# Patient Record
Sex: Male | Born: 1955 | Race: White | Hispanic: No | Marital: Married | Smoking: Former smoker
Health system: Southern US, Community
[De-identification: ages and names within clinical notes are randomized; demographics above are authoritative.]

## PROBLEM LIST (undated history)

## (undated) DIAGNOSIS — E785 Hyperlipidemia, unspecified: Secondary | ICD-10-CM

## (undated) HISTORY — DX: Hyperlipidemia, unspecified: E78.5

---

## 2000-06-22 ENCOUNTER — Other Ambulatory Visit: Admission: RE | Admit: 2000-06-22 | Discharge: 2000-06-22 | Payer: Self-pay | Admitting: Urology

## 2004-07-22 ENCOUNTER — Emergency Department (HOSPITAL_COMMUNITY): Admission: EM | Admit: 2004-07-22 | Discharge: 2004-07-22 | Payer: Self-pay | Admitting: Emergency Medicine

## 2019-03-20 ENCOUNTER — Other Ambulatory Visit: Payer: Self-pay | Admitting: Gastroenterology

## 2019-03-20 ENCOUNTER — Other Ambulatory Visit (HOSPITAL_COMMUNITY): Payer: Self-pay | Admitting: Gastroenterology

## 2019-03-20 DIAGNOSIS — R1011 Right upper quadrant pain: Secondary | ICD-10-CM

## 2019-03-24 ENCOUNTER — Ambulatory Visit: Payer: Managed Care, Other (non HMO) | Admitting: Cardiology

## 2019-03-24 ENCOUNTER — Other Ambulatory Visit: Payer: Self-pay

## 2019-03-24 ENCOUNTER — Encounter: Payer: Self-pay | Admitting: Cardiology

## 2019-03-24 VITALS — BP 107/83 | HR 91 | Temp 98.0°F | Ht 71.0 in | Wt 210.4 lb

## 2019-03-24 DIAGNOSIS — R0789 Other chest pain: Secondary | ICD-10-CM | POA: Insufficient documentation

## 2019-03-24 DIAGNOSIS — R072 Precordial pain: Secondary | ICD-10-CM

## 2019-03-24 DIAGNOSIS — Z8711 Personal history of peptic ulcer disease: Secondary | ICD-10-CM

## 2019-03-24 DIAGNOSIS — E782 Mixed hyperlipidemia: Secondary | ICD-10-CM

## 2019-03-24 MED ORDER — METOPROLOL TARTRATE 25 MG PO TABS: 25.0000 mg | ORAL_TABLET | Freq: Two times a day (BID) | ORAL | 1 refills | Status: AC

## 2019-03-24 NOTE — Progress Notes (Signed)
Patient referred by Pieter Partridge, PA for chest pain  Subjective:   Ian Avila, male    DOB: 02-24-1955, 64 y.o.   MRN: [id number]   Chief Complaint  Patient presents with  . Chest Pain  . New Patient (Initial Visit)    HPI  64 y/o Caucasian male, h/o PUD and GI bleed (in 2000s), now with epigastric/chest pain, exertional dyspea   Patient has had episodes of retrosternal chest pain "all his life". He reportedly had a stress test 30 years ago. More recently, he has had at least two episodes of pain, that woke him up from sleep. Pain was relieved after chewing Aspirin. This pain is not reproduced with exertion. He has had a separate epigastric "soreness" that is always present.   He has hyperlipidemia, is reluctant to take medications , and wants to make lifestyle changes.   He has had peptic ulcer disease 15 years ago, when he was ingesting a lot of NSAIDS. He is not taking NSAIDS anymore.  He works as a Scientist, water quality. He is originally from [place], [place]. He has lived in [place] area for ~50 years. He lives in [place] with his wife.     Past Medical History:  Diagnosis Date  . Hyperlipidemia      History reviewed. No pertinent surgical history.   Social History   Tobacco Use  Smoking Status Former Smoker  . Packs/day: 1.00  . Years: 15.00  . Pack years: 15.00  . Types: Cigarettes  . Quit date: 80  . Years since quitting: 30.1  Smokeless Tobacco Never Used    Social History   Substance and Sexual Activity  Alcohol Use Not Currently     Family History  Problem Relation Age of Onset  . Hypertension Father   . Cancer Father      No current outpatient medications on file prior to visit.   No current facility-administered medications on file prior to visit.    Cardiovascular and other pertinent studies:  EKG 03/24/2019: Sinus rhythm 84 bpm.  Normal EKG.   Recent labs: 03/14/2019: Glucose 79, BUN/Cr 14/0.92. EGFR 89. Na/K  140/4.2. Rest of the CMP normal H/H 14/42. MCV 90. Platelets 288 Chol 202, TG 182, HDL 45, LDL 152   Review of Systems  Cardiovascular: Positive for chest pain. Negative for dyspnea on exertion, leg swelling, palpitations and syncope.         Vitals:   03/24/19 1314  BP: 107/83  Pulse: 91  Temp: 98 F (36.7 C)  SpO2: 98%     Body mass index is 29.34 kg/m. Filed Weights   03/24/19 1314  Weight: 210 lb 6.4 oz (95.4 kg)     Objective:   Physical Exam  Constitutional: He appears well-developed and well-nourished.  Neck: No JVD present.  Cardiovascular: Normal rate, regular rhythm, normal heart sounds and intact distal pulses.  No murmur heard. Pulmonary/Chest: Effort normal and breath sounds normal. He has no wheezes. He has no rales.  Musculoskeletal:        General: No edema.  Nursing note and vitals reviewed.      Assessment & Recommendations:   64 y/o Caucasian male, h/o PUD and GI bleed (in 2000s), now with epigastric/chest pain, exertional dyspea   Chest pain: Atypical, but has risk factor with hyperlipidemia. He is reluctant to start statin and wants to pursue diet and lifestyle changes. It will help to have definitive coronary anatomy evaluation to make recommendations on Aspirin, statin,  given his h/o PUD as well as reluctance to use statin.   Recommend coronary CTA.  Further recommendations after the above testing.    Thank you for referring the patient to [place]. Please feel free to contact with any questions.  Nigel Mormon, MD [facility]. [facility] Pager: [phone] Office: [phone]

## 2019-03-24 NOTE — Patient Instructions (Signed)
Your cardiac CT will be scheduled at the locations below:   [facility]  [address]  [place], [place] [address]  [phone]    If scheduled at [facility], please arrive at the [facility] main entrance of [employer] 30-45 minutes prior to test start time.  Proceed to the [facility] Radiology Department (first floor) to check-in and test prep.   Please follow these instructions carefully (unless otherwise directed):    Hold all erectile dysfunction medications at least 3 days (72 hrs) prior to test.   On the Night Before the Test:   Be sure to Drink plenty of water.   Do not consume any caffeinated/decaffeinated beverages or chocolate 12 hours prior to your test.   Do not take any antihistamines 12 hours prior to your test.   If the patient has contrast allergy:  Patient will need a prescription for Prednisone and very clear instructions (as follows):  1. Prednisone 50 mg - take 13 hours prior to test  2. Take another Prednisone 50 mg 7 hours prior to test  3. Take another Prednisone 50 mg 1 hour prior to test  4. Take Benadryl 50 mg 1 hour prior to test   Patient must complete all four doses of above prophylactic medications.   Patient will need a ride after test due to Benadryl.   On the Day of the Test:   Drink plenty of water. Do not drink any water within one hour of the test.   Do not eat any food 4 hours prior to the test.   You may take your regular medications prior to the test.  - Metoprolol tartarate   Start 2 days before CT scan.    Take a dose 2 hour before the CT scan   Take the remaining pills with you.   You may stop it after the CT scan, unless specified otherwise by me.          After the Test:   Drink plenty of water.   After receiving IV contrast, you may experience a mild flushed feeling. This is normal.   On occasion, you may experience a mild rash up to 24 hours after the test. This is not dangerous.  If this occurs, you can take Benadryl 25 mg and increase your fluid intake.   If you experience trouble breathing, this can be serious. If it is severe call 911 IMMEDIATELY. If it is mild, please call our office.   If you take any of these medications: Glipizide/Metformin, Avandament, Glucavance, please do not take 48 hours after completing test unless otherwise instructed.     Please contact the cardiac imaging nurse navigator should you have any questions/concerns  Rockwell Alexandria, RN Navigator Cardiac Imaging  [facility]  [phone] Office  [phone] Cell    Diet & Lifestyle recommendations:  Physical activity recommendation (The Physical Activity Guidelines for Americans. JAMA 2018;Nov 12) At least 150-300 minutes a week of moderate-intensity, or 75-150 minutes a week of vigorous-intensity aerobic physical activity, or an equivalent combination of moderate- and vigorous-intensity aerobic activity. Adults should perform muscle-strengthening activities on 2 or more days a week. Older adults should do multicomponent physical activity that includes balance training as well as aerobic and muscle-strengthening activities. Benefits of increased physical activity include lower risk of mortality including cardiovascular mortality, lower risk of cardiovascular events and associated risk factors (hypertension and diabetes), and lower risk of many cancers (including bladder,  breast, colon, endometrium, esophagus, kidney, lung, and stomach). Additional improvments have been seen in cognition, risk of dementia, anxiety and depression, improved bone health, lower risk of falls, and associated injuries.  Dietary recommendation The 2019 ACC/AHA guidelines promote nutrition as a main fixture of cardiovascular wellness, with a recommendation for a varied diet of fruit, vegetables, fish, legumes, and whole grains (Class I), as well as recommendations to reduce sodium, cholesterol,  processed meats, and refined sugars (Class IIa recommendation).10 Sodium intake, a topic of some controversy as of late, is recommended to be kept at 1,500 mg/day or less, far below the average daily intake in the [place] of 3,409 mg/day, and notably below that of previous [place] recommendations for 300mg /day.10,11 For those unable to reach 1,500 mg/day, they recommend at least a reduction of 1000 mg/day.  A Pesco-Mediterranean Diet With Intermittent Fasting: JACC Review Topic of the Week. J Am Coll Cardiol 1324;40:1027-2536 Pesco-Mediterranean diet, it is supplemented with extra-virgin olive oil (EVOO), which is the principle fat source, along with moderate amounts of dairy (particularly yogurt and cheese) and eggs, as well as modest amounts of alcohol consumption (ideally red wine with the evening meal), but few red and processed meats.

## 2019-03-25 NOTE — Telephone Encounter (Signed)
Please read thank you

## 2019-03-25 NOTE — Telephone Encounter (Signed)
Yes. We can do an exercise nuclear stress test in the office. Would recommend checking COVID test two days prior to the stress test.  Front desk, please follow up.

## 2019-03-27 NOTE — Telephone Encounter (Signed)
Do you all make this appt.

## 2019-03-31 NOTE — Telephone Encounter (Signed)
He was supposed to get a stress test instead of CTA. Any updates?

## 2019-04-03 ENCOUNTER — Other Ambulatory Visit: Payer: Self-pay | Admitting: Cardiology

## 2019-04-03 DIAGNOSIS — R0789 Other chest pain: Secondary | ICD-10-CM

## 2019-04-07 ENCOUNTER — Ambulatory Visit (HOSPITAL_COMMUNITY)
Admission: RE | Admit: 2019-04-07 | Discharge: 2019-04-07 | Disposition: A | Discharge status: Home / Self Care | Payer: 59 | Source: Ambulatory Visit | Attending: Gastroenterology | Admitting: Gastroenterology

## 2019-04-07 ENCOUNTER — Encounter (HOSPITAL_BASED_OUTPATIENT_CLINIC_OR_DEPARTMENT_OTHER)
Admission: RE | Admit: 2019-04-07 | Discharge: 2019-04-07 | Disposition: A | Discharge status: Home / Self Care | Payer: 59 | Source: Ambulatory Visit | Attending: Gastroenterology | Admitting: Gastroenterology

## 2019-04-07 ENCOUNTER — Other Ambulatory Visit: Payer: Self-pay

## 2019-04-07 DIAGNOSIS — R1011 Right upper quadrant pain: Secondary | ICD-10-CM | POA: Insufficient documentation

## 2019-04-07 MED ORDER — TECHNETIUM TC 99M MEBROFENIN IV KIT
5.0000 | PACK | Freq: Once | INTRAVENOUS | Status: AC | PRN
  Administered 2019-04-07: 5 via INTRAVENOUS

## 2019-04-11 ENCOUNTER — Other Ambulatory Visit (HOSPITAL_COMMUNITY)
Admission: RE | Admit: 2019-04-11 | Discharge: 2019-04-11 | Disposition: A | Discharge status: Home / Self Care | Payer: 59 | Source: Ambulatory Visit | Attending: Cardiology | Admitting: Cardiology

## 2019-04-11 DIAGNOSIS — Z01812 Encounter for preprocedural laboratory examination: Secondary | ICD-10-CM | POA: Diagnosis present

## 2019-04-11 DIAGNOSIS — Z20822 Contact with and (suspected) exposure to covid-19: Secondary | ICD-10-CM | POA: Insufficient documentation

## 2019-04-11 LAB — SARS CORONAVIRUS 2 (TAT 6-24 HRS): SARS Coronavirus 2: NEGATIVE

## 2019-04-14 ENCOUNTER — Other Ambulatory Visit: Payer: Self-pay

## 2019-04-14 ENCOUNTER — Ambulatory Visit: Payer: Managed Care, Other (non HMO)

## 2019-04-14 DIAGNOSIS — R0789 Other chest pain: Secondary | ICD-10-CM

## 2019-04-15 NOTE — Progress Notes (Signed)
No major abnormalities notes on stress test. Chest pain unlikely to of cardiac origin. Please send pre-op clearance letter "low risk" to Dr. Loreta Ave.

## 2019-05-06 ENCOUNTER — Ambulatory Visit: Payer: Self-pay | Admitting: Cardiology

## 2021-12-14 IMAGING — US US ABDOMEN LIMITED
1 series · 14 of 25 positions shown · non-contrast
Comparison: None.

CLINICAL DATA: Epigastric pain

EXAM:
ULTRASOUND ABDOMEN LIMITED RIGHT UPPER QUADRANT

[Series 1: us abdomen limited ruq · 14 of 34 slices shown]
[im 1/34]
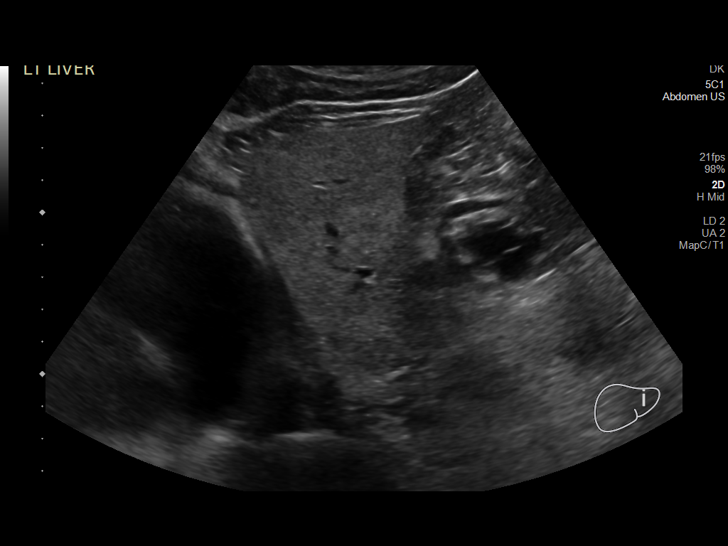
[im 3/34]
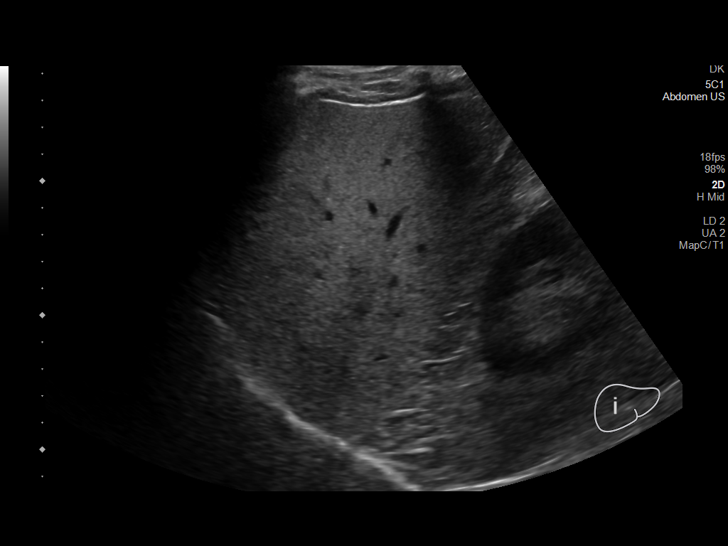
[im 6/34]
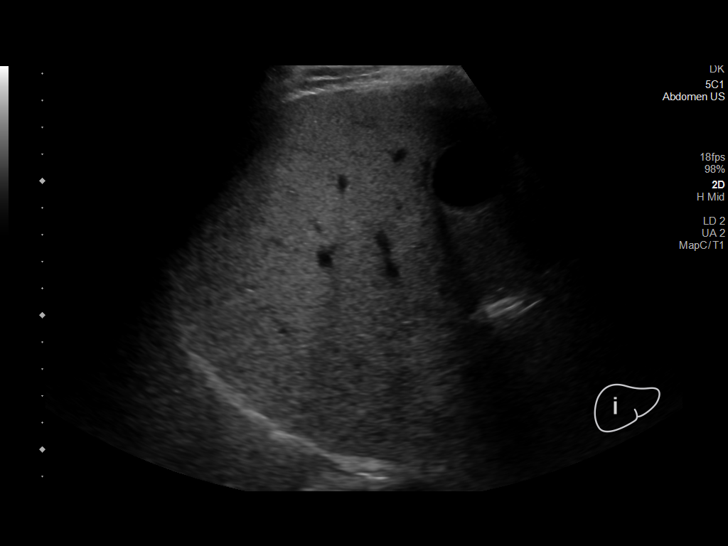
[im 9/34]
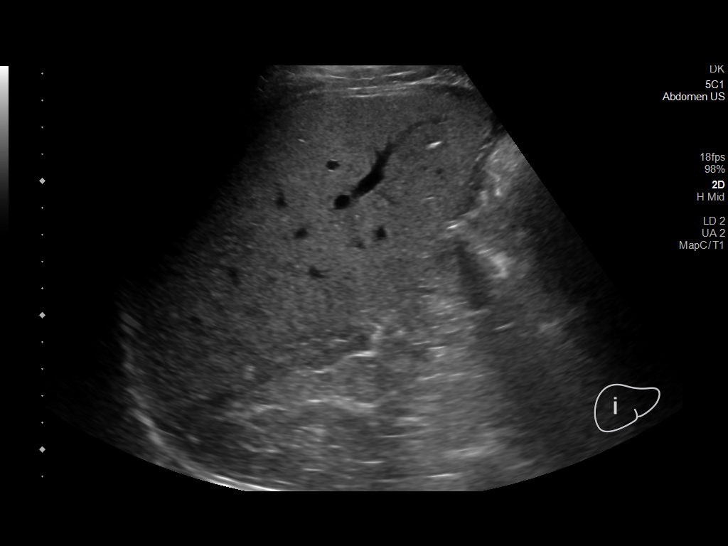
[im 12/34]
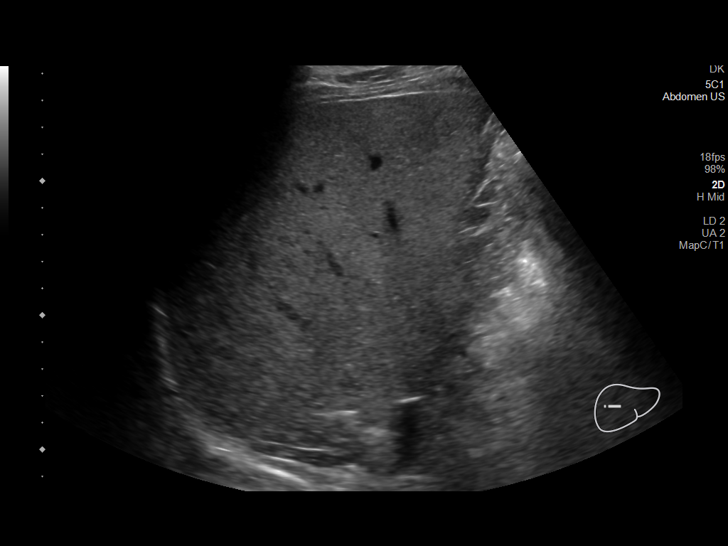
[im 13/34]
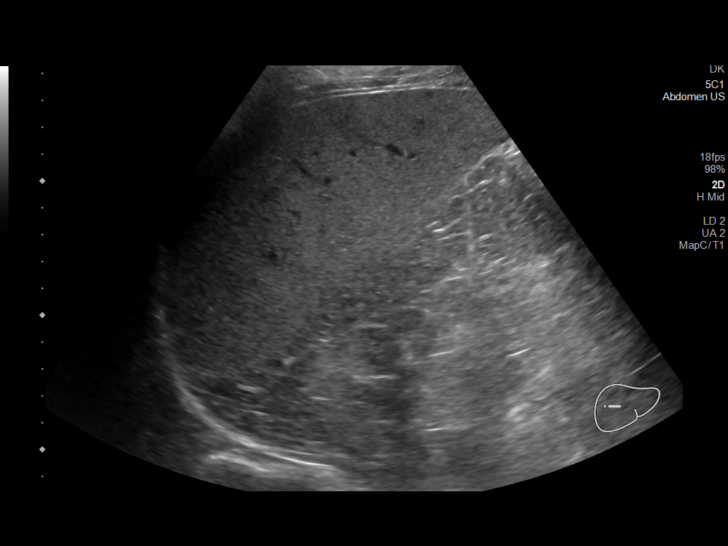
[im 16/34]
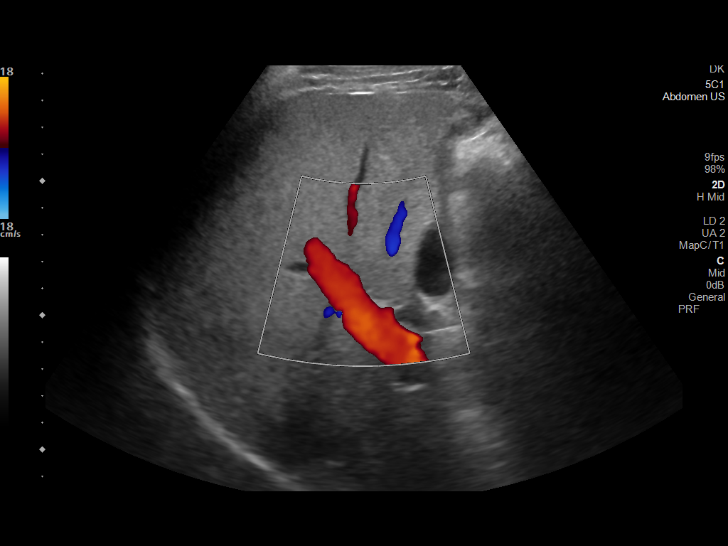
[im 18/34]
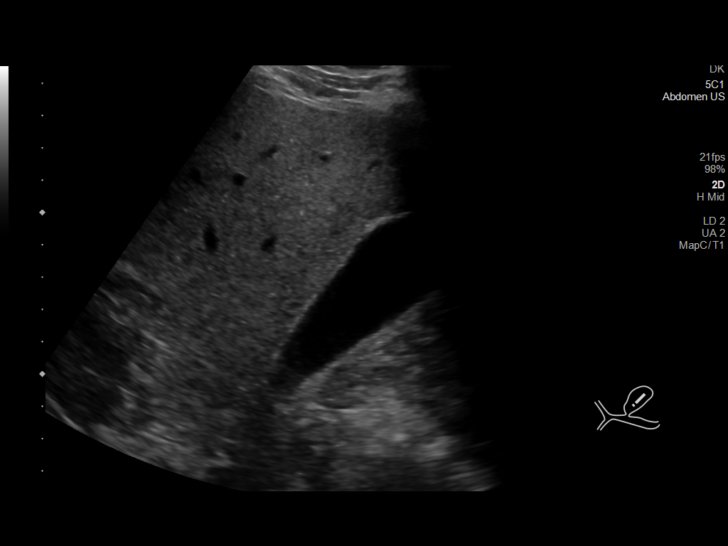
[im 21/34]
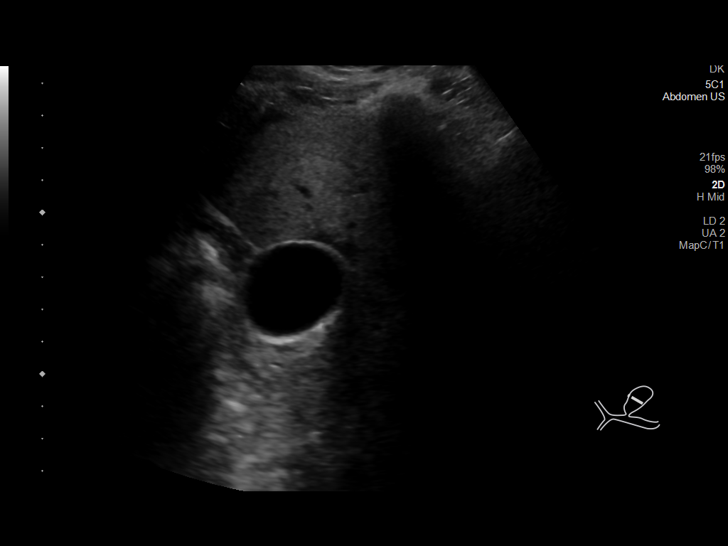
[im 23/34]
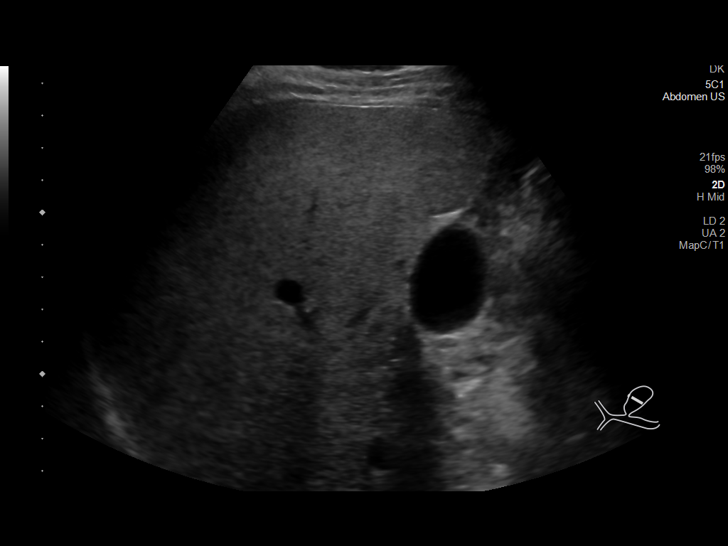
[im 25/34]
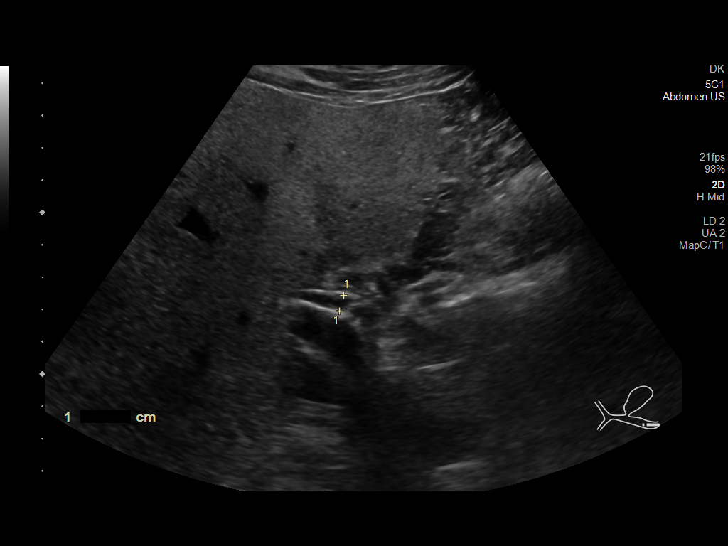
[im 28/34]
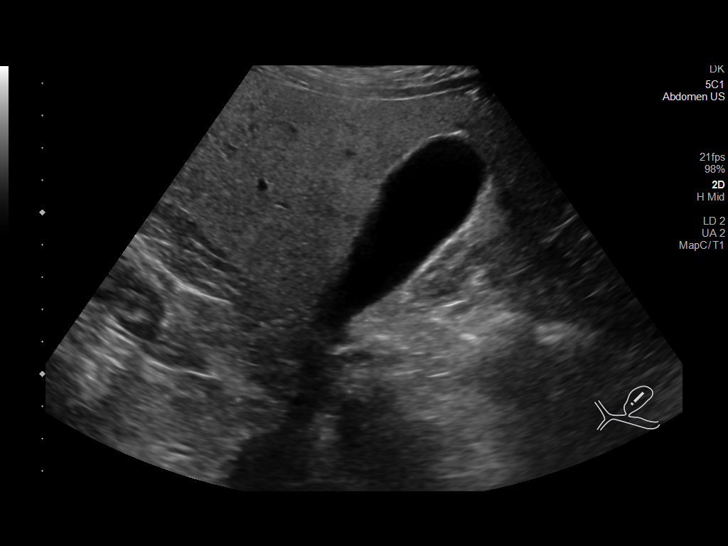
[im 31/34]
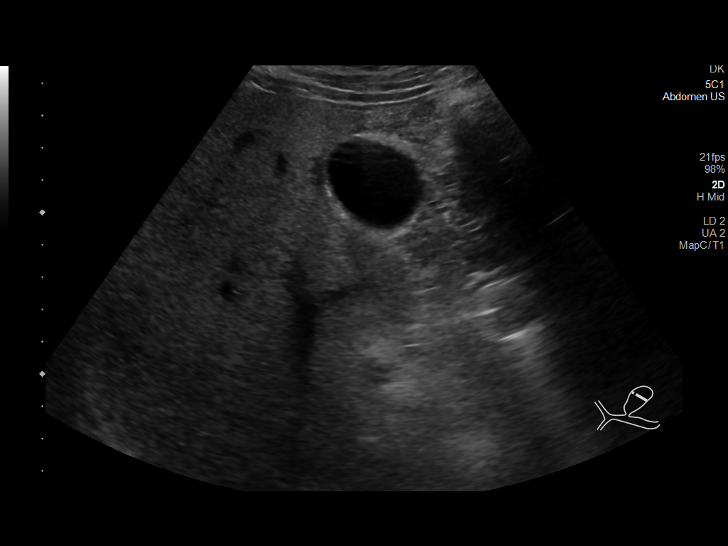
[im 34/34]
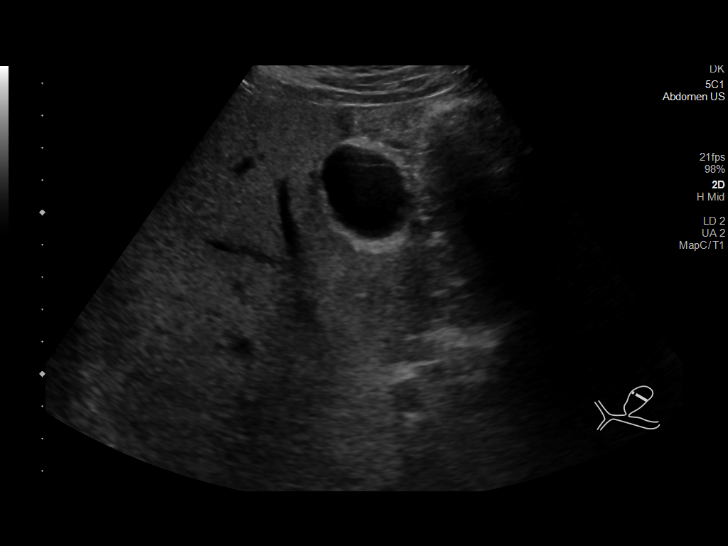

[14 of 25 positions shown; findings below may reference images not displayed]

FINDINGS: Gallbladder:

No gallstones or wall thickening visualized. No sonographic Murphy
sign noted by sonographer.

Common bile duct:

Diameter: Normal caliber, 5 mm

Liver:

Increased echotexture compatible with fatty infiltration. No focal
abnormality or biliary ductal dilatation. Portal vein is patent on
color Doppler imaging with normal direction of blood flow towards
the liver.

Other: None.
IMPRESSION: Fatty infiltration of the liver.

No acute findings.

## 2021-12-14 IMAGING — NM NM HEPATO W/GB/PHARM/[PERSON_NAME]
2 series · 12 of 12 positions shown · non-contrast
Comparison: Abdominal ultrasound 04/07/2019

CLINICAL DATA: RIGHT upper quadrant pain.

EXAM:
NUCLEAR MEDICINE HEPATOBILIARY IMAGING WITH GALLBLADDER EF
TECHNIQUE: Sequential images of the abdomen were obtained [DATE] minutes
following intravenous administration of radiopharmaceutical. After
oral ingestion of Ensure, gallbladder ejection fraction was
determined. At 60 min, normal ejection fraction is greater than 33%.
RADIOPHARMACEUTICALS:  5.2 mCi Uc-GGm  Choletec IV

[Series 1: biliary · 4.14mm/px · 6 of 60 frames shown]
[frame 6/60]
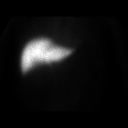
[frame 16/60]
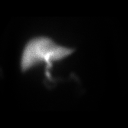
[frame 26/60]
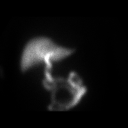
[frame 36/60]
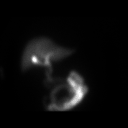
[frame 46/60]
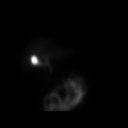
[frame 56/60]
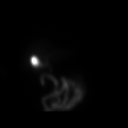

[Series 2: gbef · 4.14mm/px · 6 of 60 frames shown]
[frame 6/60]
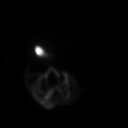
[frame 16/60]
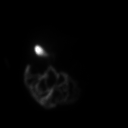
[frame 26/60]
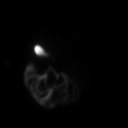
[frame 36/60]
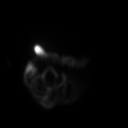
[frame 46/60]
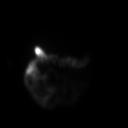
[frame 56/60]
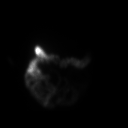

[12 of 12 positions shown; findings below may reference images not displayed]

FINDINGS: Uniform uptake of radiotracer within liver. Counts are present the
small bowel by 25 minutes. The gallbladder fills at 30 minutes.

A fatty meal was administered and the gallbladder contracts
minimally calculated ejection fraction equal 25%.

Calculated gallbladder ejection fraction is 25%. (Normal gallbladder
ejection fraction with Ensure is greater than 33%.)
IMPRESSION: 1. Patent cystic duct and common bile duct.
2. Low gallbladder ejection fraction (25%).
